# Patient Record
Sex: Male | Born: 1990
Health system: Southern US, Community
[De-identification: ages and names within clinical notes are randomized; demographics above are authoritative.]

## PROBLEM LIST (undated history)

## (undated) DIAGNOSIS — R51 Headache: Secondary | ICD-10-CM

## (undated) DIAGNOSIS — N200 Calculus of kidney: Secondary | ICD-10-CM

## (undated) DIAGNOSIS — R519 Headache, unspecified: Secondary | ICD-10-CM

## (undated) DIAGNOSIS — G43909 Migraine, unspecified, not intractable, without status migrainosus: Secondary | ICD-10-CM

## (undated) DIAGNOSIS — B019 Varicella without complication: Secondary | ICD-10-CM

## (undated) HISTORY — DX: Calculus of kidney: N20.0

## (undated) HISTORY — DX: Varicella without complication: B01.9

## (undated) HISTORY — DX: Headache, unspecified: R51.9

## (undated) HISTORY — DX: Headache: R51

## (undated) HISTORY — DX: Migraine, unspecified, not intractable, without status migrainosus: G43.909

---
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
## 2013-03-25 ENCOUNTER — Encounter: Payer: Self-pay | Admitting: Family Medicine

## 2013-03-25 ENCOUNTER — Ambulatory Visit (INDEPENDENT_AMBULATORY_CARE_PROVIDER_SITE_OTHER): Payer: Self-pay | Admitting: Family Medicine

## 2013-03-25 VITALS — BP 132/86 | Temp 98.6°F | Wt 211.0 lb

## 2013-03-25 DIAGNOSIS — L0291 Cutaneous abscess, unspecified: Secondary | ICD-10-CM

## 2013-03-25 DIAGNOSIS — L0390 Cellulitis, unspecified: Secondary | ICD-10-CM

## 2013-03-25 MED ORDER — DOXYCYCLINE HYCLATE 100 MG PO TABS: 100.0000 mg | ORAL_TABLET | Freq: Two times a day (BID) | ORAL | Status: DC

## 2013-03-25 NOTE — Progress Notes (Signed)
  Subjective:    Patient ID: Daryl Miller, male    DOB: Sep 23, 1991, 22 y.o.   MRN: [id number]  HPI  painful bump. Right lateral knee. Started as a pimple. Now sreading. Significant pain. No fever no chills.  No personal history of MRSA. Review of Systems ROS otherwise negative.    Objective:   Physical Exam  Alert no acute distress. Lungs clear. Heart regular in rhythm. Right lateral knee patch of cellulitis tender inflamed with central papule tiny amount of discharge expressed but no fluctuance      Assessment & Plan:  Impression skin structure infection with secondary cellulitis discussed plan Doxy 100 twice a day 10 days. Symptomatic care discussed. WSL

## 2013-12-10 ENCOUNTER — Ambulatory Visit (INDEPENDENT_AMBULATORY_CARE_PROVIDER_SITE_OTHER): Payer: BC Managed Care – PPO | Admitting: Family Medicine

## 2013-12-10 ENCOUNTER — Encounter: Payer: Self-pay | Admitting: Family Medicine

## 2013-12-10 VITALS — BP 120/70 | Temp 98.9°F | Ht 72.0 in | Wt 215.0 lb

## 2013-12-10 DIAGNOSIS — M25579 Pain in unspecified ankle and joints of unspecified foot: Secondary | ICD-10-CM

## 2013-12-10 DIAGNOSIS — L84 Corns and callosities: Secondary | ICD-10-CM

## 2013-12-10 NOTE — Progress Notes (Signed)
   Subjective:    Patient ID: Daryl Miller, male    DOB: [birth year], 23 y.o.   MRN: [id number]  Foot Injury  The incident occurred more than 1 week ago. Injury mechanism: fractured years ago. The pain is present in the right foot. The pain is at a severity of 6/10. The pain is moderate. The pain has been constant since onset. He reports no foreign bodies present. The symptoms are aggravated by movement. He has tried NSAIDs for the symptoms. The treatment provided mild relief.  Emesis  This is a new problem. The current episode started yesterday. The problem occurs intermittently. The problem has been unchanged. The emesis has an appearance of stomach contents. There has been no fever. He has tried nothing for the symptoms. The treatment provided no relief.   Abdominal symptoms have gone away. He feels much better   Review of Systems  Gastrointestinal: Positive for vomiting.   did not have any diarrhea no fever chills     Objective:   Physical Exam  Foot exam he has bilateral foot calluses that are very severe better causing significant problems especially on the right for he often wears tennis shoes with his work and works all day on his feet. No sign of plantar warts at this time he denies any other particular problems      Assessment & Plan:  Gastroenteritis-resolved  Significant calluses of the feet due to poor fitting shoes calluses were part down I believe that podiatrist could help remove some of these calluses and hopefully recommend possibly an insert to help him he will look into doing better shoes such as Daryl Miller

## 2014-01-09 ENCOUNTER — Encounter: Payer: Self-pay | Admitting: Family Medicine

## 2014-01-09 ENCOUNTER — Ambulatory Visit (INDEPENDENT_AMBULATORY_CARE_PROVIDER_SITE_OTHER): Payer: BC Managed Care – PPO | Admitting: Family Medicine

## 2014-01-09 VITALS — BP 130/80 | Temp 99.2°F | Ht 72.0 in | Wt 214.2 lb

## 2014-01-09 DIAGNOSIS — J019 Acute sinusitis, unspecified: Secondary | ICD-10-CM

## 2014-01-09 DIAGNOSIS — J309 Allergic rhinitis, unspecified: Secondary | ICD-10-CM

## 2014-01-09 DIAGNOSIS — R062 Wheezing: Secondary | ICD-10-CM

## 2014-01-09 MED ORDER — ALBUTEROL SULFATE HFA 108 (90 BASE) MCG/ACT IN AERS: 2.0000 | INHALATION_SPRAY | Freq: Four times a day (QID) | RESPIRATORY_TRACT | Status: DC | PRN

## 2014-01-09 MED ORDER — PREDNISONE 20 MG PO TABS: ORAL_TABLET | ORAL | Status: AC

## 2014-01-09 MED ORDER — AZITHROMYCIN 250 MG PO TABS
ORAL_TABLET | ORAL | Status: DC
Start: 2014-01-09 — End: 2014-07-03

## 2014-01-09 NOTE — Progress Notes (Signed)
   Subjective:    Patient ID: Daryl Miller, male    DOB: 02-Oct-1990, 23 y.o.   MRN: [id number]  Sinusitis This is a new problem. The current episode started in the past 7 days. Maximum temperature: 99.2. The pain is moderate. Associated symptoms include congestion, coughing, headaches, sinus pressure and sneezing. Past treatments include oral decongestants. The treatment provided no relief.  Patient states that he has no other concerns at this time.     Review of Systems  HENT: Positive for congestion, sinus pressure and sneezing.   Respiratory: Positive for cough.   Neurological: Positive for headaches.   No wheezing abdomen soft extremities no edema    Objective:   Physical Exam Eardrums normal sinus mild tenderness throat normal neck supple lungs clear heart regular       Assessment & Plan:  Allergic rhinitis prednisone taper allergy medicine as directed Reactive airway albuterol when necessary warning signs discussed Sinusitis/bronchitis Z-Pak as prescribed warnings discussed

## 2014-01-09 NOTE — Patient Instructions (Signed)
Metered Dose Inhaler (No Spacer Used) Inhaled medicines are the basis of asthma treatment and other breathing problems. Inhaled medicine can only be effective if used properly. Good technique assures that the medicine reaches the lungs. Metered dose inhalers (MDIs) are used to deliver a variety of inhaled medicines. These include quick relief or rescue medicines (such as bronchodilators) and controller medicines (such as corticosteroids). The medicine is delivered by pushing down on a metal canister to release a set amount of spray. If you are using different kinds of inhalers, use your quick relief medicine to open the airways 10 15 minutes before using a steroid if instructed to do so by your health care provider. If you are unsure which inhalers to use and the order of using them, ask your health care provider, nurse, or respiratory therapist. HOW TO USE THE INHALER 1. Remove cap from inhaler. 2. If you are using the inhaler for the first time, you will need to prime it. Shake the inhaler for 5 seconds and release four puffs into the air, away from your face. Ask your health care provider or pharmacist if you have questions about priming your inhaler. 3. Shake inhaler for 5 seconds before each breath in (inhalation). 4. Position the inhaler so that the top of the canister faces up. 5. Put your index finger on the top of the medicine canister. Your thumb supports the bottom of the inhaler. 6. Open your mouth. 7. Either place the inhaler between your teeth and place your lips tightly around the mouthpiece, or hold the inhaler 1 2 inches away from your open mouth. If you are unsure of which technique to use, ask your health care provider. 8. Breathe out (exhale) normally and as completely as possible. 9. Press the canister down with the index finger to release the medicine. 10. At the same time as the canister is pressed, inhale deeply and slowly until the lungs are completely filled. This should take  4 6 seconds. Keep your tongue down. 11. Hold the medicine in your lungs for up to 5 10 seconds (10 seconds is best). This helps the medicine get into the small airways of your lungs. 12. Breathe out slowly, through pursed lips. Whistling is an example of pursed lips. 13. Wait at least 1 minute between puffs. Continue with the above steps until you have taken the number of puffs your health care provider has ordered. Do not use the inhaler more than your health care provider directs you to. 14. Replace cap on inhaler. 15. Follow the directions from your health care provider or the inhaler insert for cleaning the inhaler. If you are using a steroid inhaler, rinse your mouth with water after your last puff, gargle, and spit out the water. Do not swallow the water. AVOID:  Inhaling before or after starting the spray of medicine. It takes practice to coordinate your breathing with triggering the spray.  Inhaling through the nose (rather than the mouth) when triggering the spray. HOW TO DETERMINE IF YOUR INHALER IS FULL OR NEARLY EMPTY You cannot know when an inhaler is empty by shaking it. A few inhalers are now being made with dose counters. Ask your health care provider for a prescription that has a dose counter if you feel you need that extra help. If your inhaler does not have a counter, ask your health care provider to help you determine the date you need to refill your inhaler. Write the refill date on a calendar or your inhaler canister.   Refill your inhaler 7 10 days before it runs out. Be sure to keep an adequate supply of medicine. This includes making sure it is not expired, and you have a spare inhaler.  SEEK MEDICAL CARE IF:   Symptoms are only partially relieved with your inhaler.  You are having trouble using your inhaler.  You experience some increase in phlegm. SEEK IMMEDIATE MEDICAL CARE IF:   You feel little or no relief with your inhalers. You are still wheezing and are feeling  shortness of breath or tightness in your chest or both.  You have dizziness, headaches, or fast heart rate.  You have chills, fever, or night sweats.  There is a noticeable increase in phlegm production, or there is blood in the phlegm. Document Released: 07/09/2007 Document Revised: 05/14/2013 Document Reviewed: 02/27/2013 [employer] Patient Information 2014 [employer].  

## 2014-07-03 ENCOUNTER — Encounter: Payer: Self-pay | Admitting: Family Medicine

## 2014-07-03 ENCOUNTER — Ambulatory Visit (INDEPENDENT_AMBULATORY_CARE_PROVIDER_SITE_OTHER): Payer: BC Managed Care – PPO | Admitting: Family Medicine

## 2014-07-03 VITALS — BP 132/80 | Temp 98.3°F | Ht 72.0 in | Wt 221.0 lb

## 2014-07-03 DIAGNOSIS — H6012 Cellulitis of left external ear: Secondary | ICD-10-CM

## 2014-07-03 MED ORDER — DOXYCYCLINE HYCLATE 100 MG PO CAPS: 100.0000 mg | ORAL_CAPSULE | Freq: Two times a day (BID) | ORAL | Status: DC

## 2014-07-03 NOTE — Progress Notes (Signed)
   Subjective:    Patient ID: Daryl Miller, male    DOB: May 10, 1991, 23 y.o.   MRN: [id number]  Otalgia  There is pain in the left ear. This is a new problem. The current episode started in the past 7 days. The problem has been unchanged. There has been no fever. The pain is moderate. Associated symptoms include headaches. He has tried NSAIDs for the symptoms. The treatment provided mild relief.   Patient states he has no other concerns at this time.    Review of Systems  HENT: Positive for ear pain.   Neurological: Positive for headaches.       Objective:   Physical Exam  On examination he does have ear canal mild cellulitis tender to touch it's very minimal I don't find an abscess. I think this should get better with antibiotics and warm compresses no need for incision and drainage      Assessment & Plan:  Antibiotics prescribed/cellulitis/gradually get better warnings discussed

## 2014-07-05 ENCOUNTER — Emergency Department (HOSPITAL_COMMUNITY)
Admission: EM | Admit: 2014-07-05 | Discharge: 2014-07-05 | Disposition: A | Discharge status: Home / Self Care | Payer: BC Managed Care – PPO | Attending: Emergency Medicine | Admitting: Emergency Medicine

## 2014-07-05 ENCOUNTER — Encounter (HOSPITAL_COMMUNITY): Payer: Self-pay | Admitting: Emergency Medicine

## 2014-07-05 DIAGNOSIS — R21 Rash and other nonspecific skin eruption: Secondary | ICD-10-CM | POA: Diagnosis not present

## 2014-07-05 DIAGNOSIS — Z792 Long term (current) use of antibiotics: Secondary | ICD-10-CM | POA: Insufficient documentation

## 2014-07-05 DIAGNOSIS — H6092 Unspecified otitis externa, left ear: Secondary | ICD-10-CM | POA: Insufficient documentation

## 2014-07-05 DIAGNOSIS — Z720 Tobacco use: Secondary | ICD-10-CM | POA: Diagnosis not present

## 2014-07-05 DIAGNOSIS — H9202 Otalgia, left ear: Secondary | ICD-10-CM | POA: Diagnosis present

## 2014-07-05 LAB — CBC WITH DIFFERENTIAL/PLATELET
Basophils Absolute: 0.1 10*3/uL (ref 0.0–0.1)
Basophils Relative: 0 % (ref 0–1)
Eosinophils Absolute: 0.1 10*3/uL (ref 0.0–0.7)
Eosinophils Relative: 1 % (ref 0–5)
HCT: 38.2 % — ABNORMAL LOW (ref 39.0–52.0)
Hemoglobin: 13.5 g/dL (ref 13.0–17.0)
Lymphocytes Relative: 34 % (ref 12–46)
Lymphs Abs: 4.1 10*3/uL — ABNORMAL HIGH (ref 0.7–4.0)
MCH: 32.7 pg (ref 26.0–34.0)
MCHC: 35.3 g/dL (ref 30.0–36.0)
MCV: 92.5 fL (ref 78.0–100.0)
Monocytes Absolute: 1.1 10*3/uL — ABNORMAL HIGH (ref 0.1–1.0)
Monocytes Relative: 9 % (ref 3–12)
Neutro Abs: 6.8 10*3/uL (ref 1.7–7.7)
Neutrophils Relative %: 56 % (ref 43–77)
Platelets: 211 10*3/uL (ref 150–400)
RBC: 4.13 MIL/uL — ABNORMAL LOW (ref 4.22–5.81)
RDW: 12.4 % (ref 11.5–15.5)
WBC: 12.2 10*3/uL — ABNORMAL HIGH (ref 4.0–10.5)

## 2014-07-05 LAB — BASIC METABOLIC PANEL
Anion gap: 12 (ref 5–15)
BUN: 12 mg/dL (ref 6–23)
CHLORIDE: 101 meq/L (ref 96–112)
CO2: 26 mEq/L (ref 19–32)
Calcium: 9 mg/dL (ref 8.4–10.5)
Creatinine, Ser: 0.79 mg/dL (ref 0.50–1.35)
GFR calc Af Amer: >90 mL/min (ref >90)
GFR calc non Af Amer: >90 mL/min (ref >90)
Glucose, Bld: 102 mg/dL — ABNORMAL HIGH (ref 70–99)
Potassium: 3.6 mEq/L — ABNORMAL LOW (ref 3.7–5.3)
Sodium: 139 mEq/L (ref 137–147)

## 2014-07-05 LAB — SEDIMENTATION RATE: SED RATE: 7 mm/h (ref 0–16)

## 2014-07-05 MED ORDER — HYDROCODONE-ACETAMINOPHEN 5-325 MG PO TABS: 1.0000 | ORAL_TABLET | Freq: Four times a day (QID) | ORAL | Status: DC | PRN

## 2014-07-05 MED ORDER — HYDROCODONE-ACETAMINOPHEN 5-325 MG PO TABS
1.0000 | ORAL_TABLET | Freq: Once | ORAL | Status: AC
  Administered 2014-07-05: 1 via ORAL
  Filled 2014-07-05: qty 1

## 2014-07-05 MED ORDER — PREDNISONE 50 MG PO TABS: 50.0000 mg | ORAL_TABLET | Freq: Every day | ORAL | Status: DC

## 2014-07-05 MED ORDER — IBUPROFEN 600 MG PO TABS: 600.0000 mg | ORAL_TABLET | Freq: Four times a day (QID) | ORAL | Status: DC | PRN

## 2014-07-05 MED ORDER — LORATADINE 10 MG PO TABS: 10.0000 mg | ORAL_TABLET | Freq: Every day | ORAL | Status: DC

## 2014-07-05 MED ORDER — AMOXICILLIN-POT CLAVULANATE 875-125 MG PO TABS
1.0000 | ORAL_TABLET | Freq: Once | ORAL | Status: AC
  Administered 2014-07-05: 1 via ORAL
  Filled 2014-07-05: qty 1

## 2014-07-05 MED ORDER — AMOXICILLIN-POT CLAVULANATE 875-125 MG PO TABS: 1.0000 | ORAL_TABLET | Freq: Two times a day (BID) | ORAL | Status: DC

## 2014-07-05 MED ORDER — PREDNISONE 50 MG PO TABS
60.0000 mg | ORAL_TABLET | Freq: Once | ORAL | Status: AC
  Administered 2014-07-05: 60 mg via ORAL
  Filled 2014-07-05 (×2): qty 1

## 2014-07-05 MED ORDER — IBUPROFEN 400 MG PO TABS
600.0000 mg | ORAL_TABLET | Freq: Once | ORAL | Status: AC
  Administered 2014-07-05: 600 mg via ORAL
  Filled 2014-07-05: qty 2

## 2014-07-05 NOTE — ED Notes (Addendum)
Pt c/o left ear pain that has progressed to the outside of the ear and around the head; pt states he went to see his PCP x 2 days ago and was given medications with no relief

## 2014-07-05 NOTE — ED Provider Notes (Signed)
CSN: [id number]     Arrival date & time 07/05/14  0033 History  This chart was scribed for Derwood KaplanAnkit Sumaiyah Markert, MD by Modena JanskyAlbert Thayil, ED Scribe. This patient was seen in room APA08/AP[facility]08 and the patient's care was started at 12:42 AM.   Chief Complaint  Patient presents with  . Otalgia   The history is provided by the patient. No language interpreter was used.   HPI Comments: Daryl Miller is a 23 y.o. male with no hx of chronic medical problems who presents to the Emergency Department complaining of left ear ache that started 5 days ago. He reports that he went to his PCP 2 days ago and was given medication without any improvement. He states that he took advil with only temporary relief. He denies any sick contacts. He denies any ear drainage or discharge.   History reviewed. No pertinent past medical history. History reviewed. No pertinent past surgical history. History reviewed. No pertinent family history. History  Substance Use Topics  . Smoking status: Current Every Day Smoker  . Smokeless tobacco: Not on file  . Alcohol Use: Not on file    Review of Systems  Constitutional: Negative for fever and chills.  HENT: Positive for ear pain. Negative for ear discharge.   Eyes: Negative for visual disturbance.  Gastrointestinal: Negative for nausea and vomiting.  Skin: Positive for rash.  Allergic/Immunologic: Negative for immunocompromised state.      Allergies  Review of patient's allergies indicates no known allergies.  Home Medications   Prior to Admission medications   Medication Sig Start Date End Date Taking? Authorizing Provider  amoxicillin-clavulanate (AUGMENTIN) 875-125 MG per tablet Take 1 tablet by mouth 2 (two) times daily. 07/05/14   Derwood KaplanAnkit Allis Quirarte, MD  doxycycline (VIBRAMYCIN) 100 MG capsule Take 1 capsule (100 mg total) by mouth 2 (two) times daily. 07/03/14   Babs SciaraScott A Luking, MD  HYDROcodone-acetaminophen (NORCO/VICODIN) 5-325 MG per tablet Take 1 tablet by mouth  every 6 (six) hours as needed. 07/05/14   Derwood KaplanAnkit Kalia Vahey, MD  ibuprofen (ADVIL,MOTRIN) 600 MG tablet Take 1 tablet (600 mg total) by mouth every 6 (six) hours as needed. 07/05/14   Derwood KaplanAnkit Laurinda Carreno, MD  loratadine (CLARITIN) 10 MG tablet Take 1 tablet (10 mg total) by mouth daily. 07/05/14   Derwood KaplanAnkit Janice Seales, MD  predniSONE (DELTASONE) 50 MG tablet Take 1 tablet (50 mg total) by mouth daily. 07/05/14   Ebunoluwa Gernert Rhunette CroftNanavati, MD   BP 159/82  Pulse 93  Temp(Src) 98.6 F (37 C) (Oral)  Resp 20  Ht 6' (1.829 m)  Wt 221 lb (100.245 kg)  BMI 29.97 kg/m2  SpO2 100% Physical Exam  Nursing note and vitals reviewed. Constitutional: He appears well-developed and well-nourished.  HENT:  RIght TM is normal. Left TM appears to be dull, no significant debri. No erythema.   Neck:  Positive mastoid TTP. Positive preauricular and postauricular adenopathy.     ED Course  Procedures (including critical care time) DIAGNOSTIC STUDIES: Oxygen Saturation is 100% on RA, normal by my interpretation.    COORDINATION OF CARE: 12:46 AM- Pt advised of plan for treatment which includes medication and labs and pt agrees.  Labs Review Labs Reviewed  CBC WITH DIFFERENTIAL - Abnormal; Notable for the following:    WBC 12.2 (*)    RBC 4.13 (*)    HCT 38.2 (*)    Lymphs Abs 4.1 (*)    Monocytes Absolute 1.1 (*)    All other components within normal limits  BASIC METABOLIC  PANEL - Abnormal; Notable for the following:    Potassium 3.6 (*)    Glucose, Bld 102 (*)    All other components within normal limits  SEDIMENTATION RATE    Imaging Review No results found.   EKG Interpretation None      MDM   Final diagnoses:  Otitis externa of left ear    I personally performed the services described in this documentation, which was scribed in my presence. The recorded information has been reviewed and is accurate.  Pt comes in with earache. Pt has mild erythema on my exam around the ear, but the ear exam really  doesn't reveal significant debri. Concerns for mild malignant otitis externa though, as pt's sx have gotten worse and his pain has spread around the ear. Pt is immunocompetent, non toxic, and has no SIRS criteria. His sed rate is normal. I doubt that the infection has spread to the skull or meninges - and so we will not get CT scan (pt does have tenderness over mastoid process).  Pt on doxy - and i have asked him to stop taking it, and we will start him on Augmentin. Return precautions discussed.   Derwood KaplanAnkit Jaelen Gellerman, MD 07/06/14 [phone]

## 2014-07-05 NOTE — Discharge Instructions (Signed)
Please return to the ER if your symptoms worsen; rash is getting worse, you have increased pain, headaches, fevers, chills, sweats, inability to keep any medications down, confusion. Otherwise see the outpatient doctor as requested.   Otitis Externa Otitis externa is a bacterial or fungal infection of the outer ear canal. This is the area from the eardrum to the outside of the ear. Otitis externa is sometimes called "swimmer's ear." CAUSES  Possible causes of infection include:  Swimming in dirty water.  Moisture remaining in the ear after swimming or bathing.  Mild injury (trauma) to the ear.  Objects stuck in the ear (foreign body).  Cuts or scrapes (abrasions) on the outside of the ear. SIGNS AND SYMPTOMS  The first symptom of infection is often itching in the ear canal. Later signs and symptoms may include swelling and redness of the ear canal, ear pain, and yellowish-white fluid (pus) coming from the ear. The ear pain may be worse when pulling on the earlobe. DIAGNOSIS  Your health care provider will perform a physical exam. A sample of fluid may be taken from the ear and examined for bacteria or fungi. TREATMENT  Antibiotic ear drops are often given for 10 to 14 days. Treatment may also include pain medicine or corticosteroids to reduce itching and swelling. HOME CARE INSTRUCTIONS   Apply antibiotic ear drops to the ear canal as prescribed by your health care provider.  Take medicines only as directed by your health care provider.  If you have diabetes, follow any additional treatment instructions from your health care provider.  Keep all follow-up visits as directed by your health care provider. PREVENTION   Keep your ear dry. Use the corner of a towel to absorb water out of the ear canal after swimming or bathing.  Avoid scratching or putting objects inside your ear. This can damage the ear canal or remove the protective wax that lines the canal. This makes it easier for  bacteria and fungi to grow.  Avoid swimming in lakes, polluted water, or poorly chlorinated pools.  You may use ear drops made of rubbing alcohol and vinegar after swimming. Combine equal parts of white vinegar and alcohol in a bottle. Put 3 or 4 drops into each ear after swimming. SEEK MEDICAL CARE IF:   You have a fever.  Your ear is still red, swollen, painful, or draining pus after 3 days.  Your redness, swelling, or pain gets worse.  You have a severe headache.  You have redness, swelling, pain, or tenderness in the area behind your ear. MAKE SURE YOU:   Understand these instructions.  Will watch your condition.  Will get help right away if you are not doing well or get worse. Document Released: 09/11/2005 Document Revised: 01/26/2014 Document Reviewed: 09/28/2011 [facility] Patient Information 2015 [employer] [place]. This information is not intended to replace advice given to you by your health care provider. Make sure you discuss any questions you have with your health care provider.

## 2015-01-13 ENCOUNTER — Ambulatory Visit: Payer: Self-pay | Admitting: Family Medicine

## 2015-06-21 ENCOUNTER — Ambulatory Visit (INDEPENDENT_AMBULATORY_CARE_PROVIDER_SITE_OTHER): Payer: BLUE CROSS/BLUE SHIELD | Admitting: Family Medicine

## 2015-06-21 ENCOUNTER — Encounter: Payer: Self-pay | Admitting: Family Medicine

## 2015-06-21 VITALS — BP 136/86 | Temp 99.1°F | Ht 72.0 in | Wt 231.0 lb

## 2015-06-21 DIAGNOSIS — J329 Chronic sinusitis, unspecified: Secondary | ICD-10-CM

## 2015-06-21 MED ORDER — AMOXICILLIN-POT CLAVULANATE 875-125 MG PO TABS: 1.0000 | ORAL_TABLET | Freq: Two times a day (BID) | ORAL | Status: AC

## 2015-06-21 NOTE — Progress Notes (Signed)
   Subjective:    Patient ID: Daryl Miller, male    DOB: [birth year], 24 y.o.   MRN: [id number]  Sinusitis This is a new problem. Episode onset: 1 week. Associated symptoms include headaches and a sore throat. (Vomiting, Runny nose) Treatments tried: vitamin C.    Unfortunately patient still smoking., Vomited today with a bad episode of coughing Frontal headache  Cough and cong,  Nauseated cough and sec vomiting  Review of Systems  HENT: Positive for sore throat.   Neurological: Positive for headaches.       Objective:   Physical Exam  Alert vitals stable mild malaise. HEENT moderate nasal congestion. Normal lungs bronchial cough no wheezes or crackles heart regular in rhythm.      Assessment & Plan:  Impression post viral rhinosinusitis/bronchitis plan antibiotics prescribed. Symptom care discussed encouraged to stop smoking WSL

## 2016-05-17 ENCOUNTER — Encounter (HOSPITAL_COMMUNITY): Payer: Self-pay

## 2016-05-17 ENCOUNTER — Emergency Department (HOSPITAL_COMMUNITY)
Admission: EM | Admit: 2016-05-17 | Discharge: 2016-05-17 | Disposition: A | Discharge status: Home / Self Care | Payer: Self-pay | Attending: Emergency Medicine | Admitting: Emergency Medicine

## 2016-05-17 ENCOUNTER — Emergency Department (HOSPITAL_COMMUNITY): Payer: Self-pay

## 2016-05-17 DIAGNOSIS — F17210 Nicotine dependence, cigarettes, uncomplicated: Secondary | ICD-10-CM | POA: Insufficient documentation

## 2016-05-17 DIAGNOSIS — R319 Hematuria, unspecified: Secondary | ICD-10-CM | POA: Insufficient documentation

## 2016-05-17 DIAGNOSIS — R630 Anorexia: Secondary | ICD-10-CM | POA: Insufficient documentation

## 2016-05-17 LAB — COMPREHENSIVE METABOLIC PANEL
ALK PHOS: 48 U/L (ref 38–126)
ALT: 32 U/L (ref 17–63)
AST: 25 U/L (ref 15–41)
Albumin: 4.6 g/dL (ref 3.5–5.0)
Anion gap: 10 (ref 5–15)
BUN: 14 mg/dL (ref 6–20)
CALCIUM: 9.2 mg/dL (ref 8.9–10.3)
CO2: 25 mmol/L (ref 22–32)
CREATININE: 0.9 mg/dL (ref 0.61–1.24)
Chloride: 100 mmol/L — ABNORMAL LOW (ref 101–111)
GFR calc non Af Amer: >60 mL/min (ref >60)
Glucose, Bld: 121 mg/dL — ABNORMAL HIGH (ref 65–99)
Potassium: 3.4 mmol/L — ABNORMAL LOW (ref 3.5–5.1)
SODIUM: 135 mmol/L (ref 135–145)
Total Bilirubin: 0.5 mg/dL (ref 0.3–1.2)
Total Protein: 7.9 g/dL (ref 6.5–8.1)

## 2016-05-17 LAB — CBC WITH DIFFERENTIAL/PLATELET
Basophils Absolute: 0.1 10*3/uL (ref 0.0–0.1)
Basophils Relative: 0 %
EOS ABS: 0.2 10*3/uL (ref 0.0–0.7)
Eosinophils Relative: 1 %
HCT: 41.1 % (ref 39.0–52.0)
HEMOGLOBIN: 14.3 g/dL (ref 13.0–17.0)
LYMPHS ABS: 4.8 10*3/uL — AB (ref 0.7–4.0)
LYMPHS PCT: 33 %
MCH: 32.4 pg (ref 26.0–34.0)
MCHC: 34.8 g/dL (ref 30.0–36.0)
MCV: 93 fL (ref 78.0–100.0)
Monocytes Absolute: 0.9 10*3/uL (ref 0.1–1.0)
Monocytes Relative: 6 %
NEUTROS PCT: 60 %
Neutro Abs: 8.5 10*3/uL — ABNORMAL HIGH (ref 1.7–7.7)
Platelets: 236 10*3/uL (ref 150–400)
RBC: 4.42 MIL/uL (ref 4.22–5.81)
RDW: 12.5 % (ref 11.5–15.5)
WBC: 14.4 10*3/uL — AB (ref 4.0–10.5)

## 2016-05-17 LAB — URINALYSIS, ROUTINE W REFLEX MICROSCOPIC
Glucose, UA: NEGATIVE mg/dL
Ketones, ur: 40 mg/dL — AB
Leukocytes, UA: NEGATIVE
Nitrite: NEGATIVE
Protein, ur: 100 mg/dL — AB
Specific Gravity, Urine: 1.025 (ref 1.005–1.030)
pH: 7 (ref 5.0–8.0)

## 2016-05-17 LAB — URINE MICROSCOPIC-ADD ON

## 2016-05-17 MED ORDER — ONDANSETRON 4 MG PO TBDP: ORAL_TABLET | ORAL | 0 refills | Status: DC

## 2016-05-17 MED ORDER — OXYCODONE-ACETAMINOPHEN 5-325 MG PO TABS: 1.0000 | ORAL_TABLET | Freq: Four times a day (QID) | ORAL | 0 refills | Status: DC | PRN

## 2016-05-17 MED ORDER — ONDANSETRON HCL 4 MG/2ML IJ SOLN
4.0000 mg | Freq: Once | INTRAMUSCULAR | Status: AC
  Administered 2016-05-17: 4 mg via INTRAVENOUS

## 2016-05-17 MED ORDER — ONDANSETRON HCL 4 MG/2ML IJ SOLN
4.0000 mg | Freq: Once | INTRAMUSCULAR | Status: AC
  Administered 2016-05-17: 4 mg via INTRAVENOUS
  Filled 2016-05-17: qty 2

## 2016-05-17 MED ORDER — HYDROMORPHONE HCL 1 MG/ML IJ SOLN
1.0000 mg | Freq: Once | INTRAMUSCULAR | Status: AC
Start: 2016-05-17 — End: 2016-05-17
  Administered 2016-05-17: 1 mg via INTRAVENOUS
  Filled 2016-05-17: qty 1

## 2016-05-17 MED ORDER — HYDROMORPHONE HCL 1 MG/ML IJ SOLN
0.5000 mg | Freq: Once | INTRAMUSCULAR | Status: AC
  Administered 2016-05-17: 0.5 mg via INTRAVENOUS
  Filled 2016-05-17: qty 1

## 2016-05-17 MED ORDER — FENTANYL CITRATE (PF) 100 MCG/2ML IJ SOLN: 50.0000 ug | INTRAMUSCULAR | Status: DC | PRN

## 2016-05-17 MED ORDER — TAMSULOSIN HCL 0.4 MG PO CAPS: 0.4000 mg | ORAL_CAPSULE | Freq: Every day | ORAL | 0 refills | Status: DC

## 2016-05-17 MED ORDER — ONDANSETRON HCL 4 MG/2ML IJ SOLN
INTRAMUSCULAR | Status: AC
  Filled 2016-05-17: qty 2

## 2016-05-17 MED ORDER — KETOROLAC TROMETHAMINE 30 MG/ML IJ SOLN
30.0000 mg | Freq: Once | INTRAMUSCULAR | Status: AC
  Administered 2016-05-17: 30 mg via INTRAVENOUS
  Filled 2016-05-17: qty 1

## 2016-05-17 NOTE — ED Notes (Signed)
To X-ray

## 2016-05-17 NOTE — Discharge Instructions (Signed)
Follow-up with [facility] next week. [place]. Return sooner if problems

## 2016-05-17 NOTE — ED Triage Notes (Signed)
RLQ pain into testicles X1 week, c/o nausea and blood in urine

## 2016-05-17 NOTE — ED Provider Notes (Signed)
[facility] DEPT Provider Note   CSN: [id number] Arrival date & time: 05/17/16  1842     History   Chief Complaint Chief Complaint  Patient presents with  . Abdominal Pain    HPI Daryl Miller is a 25 y.o. male.  Patient complaining of right flank pain for a few days worse today   The history is provided by the patient.  Abdominal Pain   This is a new problem. The current episode started more than 2 days ago. The problem occurs constantly. The problem has not changed since onset.Associated with: Nothing. Pain location: No pain on his right flank. The quality of the pain is dull. The pain is at a severity of 5/10. The pain is severe. Associated symptoms include anorexia. Pertinent negatives include diarrhea, frequency, hematuria and headaches. Nothing aggravates the symptoms. Nothing relieves the symptoms. Past workup does not include GI consult. His past medical history does not include PUD.    History reviewed. No pertinent past medical history.  Patient Active Problem List   Diagnosis Date Noted  . CLOSED FRACTURE OF METATARSAL BONE 06/22/2008    History reviewed. No pertinent surgical history.     Home Medications    Prior to Admission medications   Medication Sig Start Date End Date Taking? Authorizing Provider  ondansetron (ZOFRAN ODT) 4 MG disintegrating tablet 4mg  ODT q4 hours prn nausea/vomit 05/17/16   Bethann Berkshire, MD  oxyCODONE-acetaminophen (PERCOCET/ROXICET) 5-325 MG tablet Take 1 tablet by mouth every 6 (six) hours as needed. 05/17/16   Bethann Berkshire, MD  tamsulosin (FLOMAX) 0.4 MG CAPS capsule Take 1 capsule (0.4 mg total) by mouth daily. 05/17/16   Bethann Berkshire, MD    Family History History reviewed. No pertinent family history.  Social History Social History  Substance Use Topics  . Smoking status: Current Every Day Smoker    Packs/day: 1.00    Types: Cigarettes  . Smokeless tobacco: Never Used  . Alcohol use No     Allergies     Review of patient's allergies indicates no known allergies.   Review of Systems Review of Systems  Constitutional: Negative for appetite change and fatigue.  HENT: Negative for congestion, ear discharge and sinus pressure.   Eyes: Negative for discharge.  Respiratory: Negative for cough.   Cardiovascular: Negative for chest pain.  Gastrointestinal: Positive for abdominal pain and anorexia. Negative for diarrhea.  Genitourinary: Negative for frequency and hematuria.       Flank pain  Musculoskeletal: Negative for back pain.  Skin: Negative for rash.  Neurological: Negative for seizures and headaches.  Psychiatric/Behavioral: Negative for hallucinations.     Physical Exam Updated Vital Signs BP 123/68   Pulse (!) 59   Temp 98.4 F (36.9 C) (Tympanic)   Resp 22   Ht 6' (1.829 m)   Wt 220 lb (99.8 kg)   SpO2 99%   BMI 29.84 kg/m   Physical Exam  Constitutional: He is oriented to person, place, and time. He appears well-developed. He appears distressed.  HENT:  Head: Normocephalic.  Eyes: Conjunctivae and EOM are normal. No scleral icterus.  Neck: Neck supple. No thyromegaly present.  Cardiovascular: Normal rate and regular rhythm.  Exam reveals no gallop and no friction rub.   No murmur heard. Pulmonary/Chest: No stridor. He has no wheezes. He has no rales. He exhibits no tenderness.  Abdominal: He exhibits no distension. There is no tenderness. There is no rebound.  Genitourinary:  Genitourinary Comments: Tender right flank  Musculoskeletal: Normal  range of motion. He exhibits no edema.  Lymphadenopathy:    He has no cervical adenopathy.  Neurological: He is oriented to person, place, and time. He exhibits normal muscle tone. Coordination normal.  Skin: No rash noted. He is diaphoretic. No erythema.  Psychiatric: He has a normal mood and affect. His behavior is normal.     ED Treatments / Results  Labs (all labs ordered are listed, but only abnormal results are  displayed) Labs Reviewed  URINALYSIS, ROUTINE W REFLEX MICROSCOPIC (NOT AT [facility]) - Abnormal; Notable for the following:       Result Value   Color, Urine AMBER (*)    APPearance CLOUDY (*)    Hgb urine dipstick LARGE (*)    Bilirubin Urine SMALL (*)    Ketones, ur 40 (*)    Protein, ur 100 (*)    All other components within normal limits  CBC WITH DIFFERENTIAL/PLATELET - Abnormal; Notable for the following:    WBC 14.4 (*)    Neutro Abs 8.5 (*)    Lymphs Abs 4.8 (*)    All other components within normal limits  COMPREHENSIVE METABOLIC PANEL - Abnormal; Notable for the following:    Potassium 3.4 (*)    Chloride 100 (*)    Glucose, Bld 121 (*)    All other components within normal limits  URINE MICROSCOPIC-ADD ON - Abnormal; Notable for the following:    Squamous Epithelial / LPF 0-5 (*)    Bacteria, UA FEW (*)    All other components within normal limits    EKG  EKG Interpretation None       Radiology Dg Abdomen 1 View  Result Date: 05/17/2016 CLINICAL DATA:  Hematuria, RIGHT abdominal pain. Assess for kidney stones. EXAM: ABDOMEN - 1 VIEW COMPARISON:  None. FINDINGS: The bowel gas pattern is normal. No radio-opaque calculi or other significant radiographic abnormality are seen. Phleboliths in the pelvis. IMPRESSION: Negative. Electronically Signed   By: Awilda Metroourtnay  Bloomer M.D.   On: 05/17/2016 20:04    Procedures Procedures (including critical care time)  Medications Ordered in ED Medications  ondansetron (ZOFRAN) injection 4 mg (4 mg Intravenous Given 05/17/16 1934)  HYDROmorphone (DILAUDID) injection 1 mg (1 mg Intravenous Given 05/17/16 1934)  ketorolac (TORADOL) 30 MG/ML injection 30 mg (30 mg Intravenous Given 05/17/16 1934)  HYDROmorphone (DILAUDID) injection 0.5 mg (0.5 mg Intravenous Given 05/17/16 2056)  ondansetron (ZOFRAN) injection 4 mg (4 mg Intravenous Given 05/17/16 2114)     Initial Impression / Assessment and Plan / ED Course  I have reviewed the  triage vital signs and the nursing notes.  Pertinent labs & imaging results that were available during my care of the patient were reviewed by me and considered in my medical decision making (see chart for details).  Clinical Course    Patient improved with pain medicine and nausea medicine. Urinalysis shows hematuria. Patient got a KUB done which was unremarkable. CT scan and was down ultrasound was not available. Patient symptoms improved and was discharged home with pain medicine nausea medicine Flomax and will follow-up with the urologist next week  Final Clinical Impressions(s) / ED Diagnoses   Final diagnoses:  Hematuria    New Prescriptions New Prescriptions   ONDANSETRON (ZOFRAN ODT) 4 MG DISINTEGRATING TABLET    4mg  ODT q4 hours prn nausea/vomit   OXYCODONE-ACETAMINOPHEN (PERCOCET/ROXICET) 5-325 MG TABLET    Take 1 tablet by mouth every 6 (six) hours as needed.   TAMSULOSIN (FLOMAX) 0.4 MG CAPS CAPSULE  Take 1 capsule (0.4 mg total) by mouth daily.     Bethann BerkshireJoseph Eero Dini, MD 05/17/16 2144

## 2017-05-02 IMAGING — DX DG ABDOMEN 1V
1 series · 2 of 2 positions shown · non-contrast
Comparison: None.

CLINICAL DATA: Hematuria, RIGHT abdominal pain. Assess for kidney
stones.

EXAM:
ABDOMEN - 1 VIEW

[Series 1: abdomen kub · 0.14mm/px · 2 of 2 slices shown]
[im 1/2]
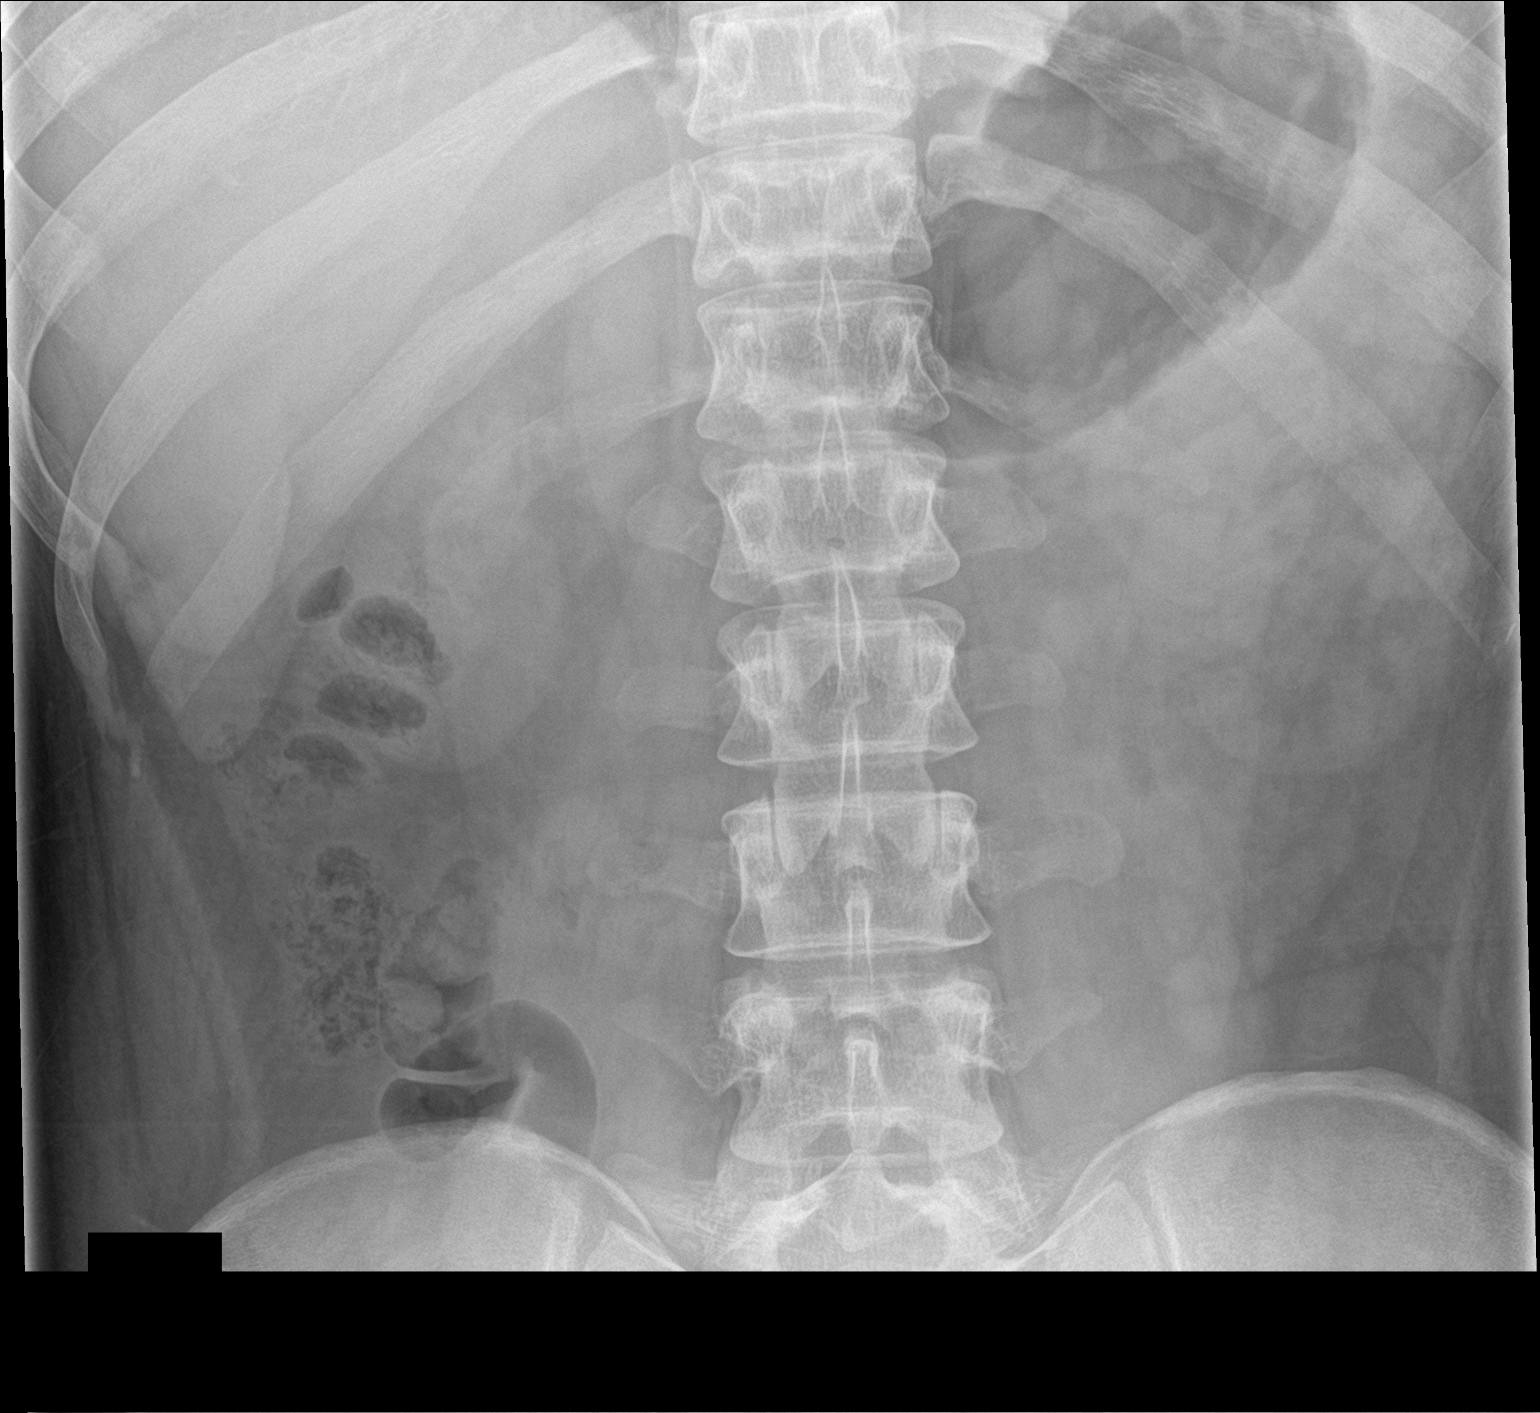
[im 2/2]
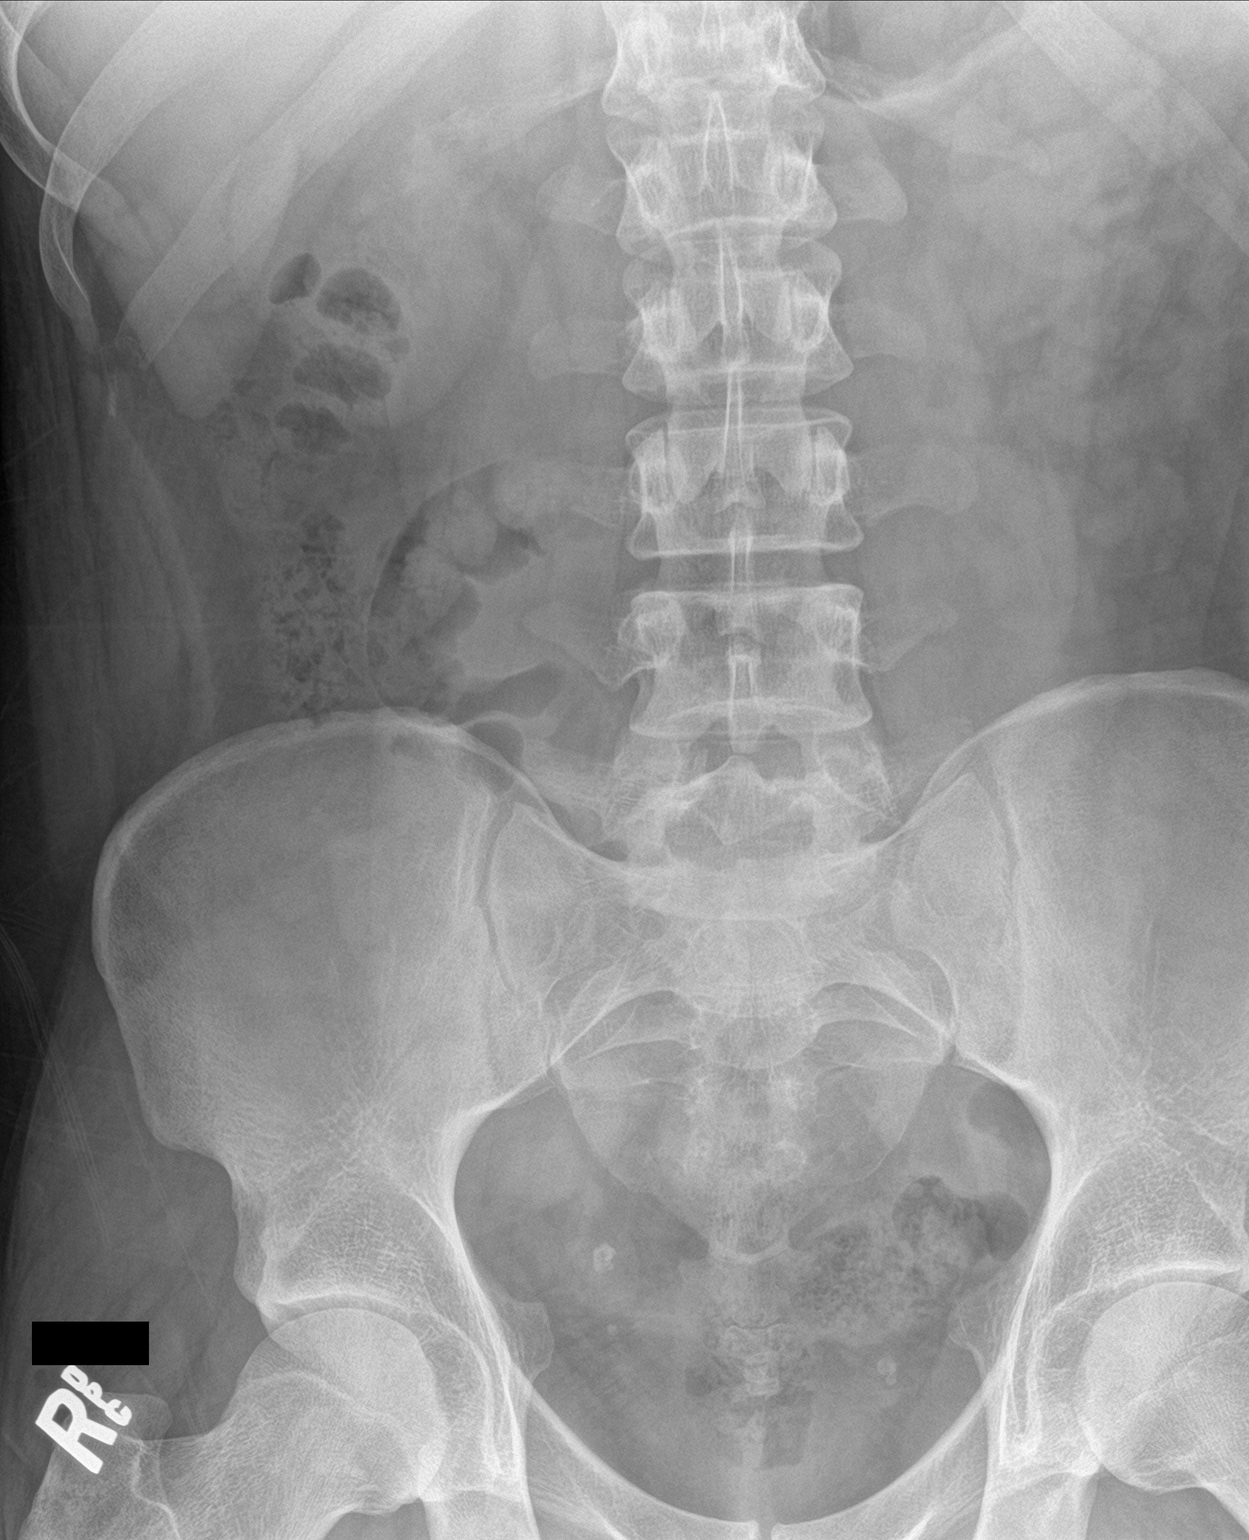

[2 of 2 positions shown; findings below may reference images not displayed]

FINDINGS: The bowel gas pattern is normal. No radio-opaque calculi or other
significant radiographic abnormality are seen. Phleboliths in the
pelvis.
IMPRESSION: Negative.

## 2018-10-21 ENCOUNTER — Ambulatory Visit: Payer: BLUE CROSS/BLUE SHIELD | Admitting: Medical

## 2018-10-21 ENCOUNTER — Encounter: Payer: Self-pay | Admitting: Medical

## 2018-10-21 VITALS — BP 159/88 | HR 62 | Temp 97.5°F | Resp 18 | Wt 269.4 lb

## 2018-10-21 DIAGNOSIS — H6123 Impacted cerumen, bilateral: Secondary | ICD-10-CM

## 2018-10-21 DIAGNOSIS — S39012A Strain of muscle, fascia and tendon of lower back, initial encounter: Secondary | ICD-10-CM

## 2018-10-21 MED ORDER — IBUPROFEN 800 MG PO TABS: 800.0000 mg | ORAL_TABLET | Freq: Three times a day (TID) | ORAL | 0 refills | Status: AC | PRN

## 2018-10-21 MED ORDER — CYCLOBENZAPRINE HCL 5 MG PO TABS
5.0000 mg | ORAL_TABLET | Freq: Every day | ORAL | 1 refills | Status: DC
Start: 2018-10-21 — End: 2020-05-21

## 2018-10-21 NOTE — Progress Notes (Addendum)
Subjective:    Patient ID: Daryl Miller, male    DOB: 02/16/1991, 28 y.o.   MRN: [id number]  HPI 28 yo male in non acute distress.  Herd today for right sided back pain x 3 4 days ago .  Mild discomfort laying on back at night. Cough started 3 days ago non product.  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 Taking Advil 600mg  twice a day, and resting.  Smoker  1/2 ppd.   Works at [employer] in [place] , says he can do his job today.  Blood pressure (!) 159/88, pulse 62, temperature (!) 97.5 F (36.4 C), temperature source Tympanic, resp. rate 18, weight 269 lb 6.4 oz (122.2 kg), SpO2 100 %. No Known Allergies  Review of Systems  Constitutional: Negative for chills, fatigue and fever.  HENT: Positive for congestion (2 weeks ago better now). Negative for ear discharge, ear pain, postnasal drip, rhinorrhea, sinus pressure, sinus pain, sneezing and sore throat.   Respiratory: Positive for shortness of breath. Negative for cough, chest tightness and wheezing.   Cardiovascular: Negative for chest pain.  Gastrointestinal: Negative for abdominal pain.  Endocrine: Negative for polydipsia, polyphagia and polyuria.  Genitourinary: Negative for dysuria and hematuria.  Musculoskeletal: Positive for back pain (right side mid back painful with palpation).  Skin: Negative for rash.  Allergic/Immunologic: Positive for environmental allergies. Negative for food allergies.  Neurological: Negative for dizziness, syncope and headaches.  Hematological: Negative for adenopathy.  Psychiatric/Behavioral: Negative for behavioral problems, confusion, self-injury and suicidal ideas.   No numbness or tingling, no radiation. Had neck pain on the right yesterday resolved today. Painful to breath    Objective:   Physical Exam Vitals signs and nursing note reviewed.  Constitutional:      Appearance: Normal appearance.  HENT:     Head: Normocephalic and  atraumatic.     Right Ear: Hearing, ear canal and external ear normal. There is impacted cerumen.     Left Ear: Hearing, ear canal and external ear normal. There is impacted cerumen.     Mouth/Throat:     Mouth: Mucous membranes are moist.     Pharynx: Oropharynx is clear.  Eyes:     Extraocular Movements: Extraocular movements intact.     Conjunctiva/sclera: Conjunctivae normal.     Pupils: Pupils are equal, round, and reactive to light.  Neck:     Musculoskeletal: Normal range of motion and neck supple.  Cardiovascular:     Rate and Rhythm: Normal rate and regular rhythm.     Heart sounds: Normal heart sounds.  Pulmonary:     Effort: Pulmonary effort is normal.     Breath sounds: Normal breath sounds.  Chest:     Chest wall: No tenderness.  Musculoskeletal:        General: Tenderness (right mid back lateral side) present.       Arms:  Skin:    General: Skin is warm and dry.     Capillary Refill: Capillary refill takes less than 2 seconds.     Findings: No erythema, lesion or rash.  Neurological:     General: No focal deficit present.     Mental Status: He is alert and oriented to person, place, and time.  Psychiatric:        Mood and Affect: Mood normal.        Behavior: Behavior normal.  Thought Content: Thought content normal.        Judgment: Judgment normal.    Clearing throat a lot in room. Can raise arms over his  head.   easily puts jacket on, Easily gets off exam table. Heel to toe within normal   Assessment & Plan:  Back strain right thoracic Cerumen impaction bilaterally Do not drink alcohol with muscle relaxer. Warned patient of sedation of SMR.  Meds ordered this encounter  Medications  . ibuprofen (ADVIL,MOTRIN) 800 MG tablet    Sig: Take 1 tablet (800 mg total) by mouth every 8 (eight) hours as needed.    Dispense:  30 tablet    Refill:  0  . cyclobenzaprine (FLEXERIL) 5 MG tablet    Sig: Take 1 tablet (5 mg total) by mouth at bedtime.     Dispense:  10 tablet    Refill:  1  warm compresses to the ara. He declines AVS. 3-5  Day follow up with PCP or here if not improving .Patient verbalizes understanding and has no questions at discharge.

## 2019-08-06 ENCOUNTER — Other Ambulatory Visit: Payer: Self-pay | Admitting: *Deleted

## 2019-08-06 DIAGNOSIS — Z20822 Contact with and (suspected) exposure to covid-19: Secondary | ICD-10-CM

## 2019-08-08 LAB — NOVEL CORONAVIRUS, NAA: SARS-CoV-2, NAA: NOT DETECTED

## 2019-09-04 ENCOUNTER — Other Ambulatory Visit: Payer: Self-pay

## 2019-09-04 DIAGNOSIS — Z20822 Contact with and (suspected) exposure to covid-19: Secondary | ICD-10-CM

## 2019-09-05 LAB — SPECIMEN STATUS REPORT

## 2019-09-06 LAB — SPECIMEN STATUS REPORT

## 2019-09-09 ENCOUNTER — Encounter: Payer: Self-pay | Admitting: Family Medicine

## 2019-09-09 ENCOUNTER — Ambulatory Visit (INDEPENDENT_AMBULATORY_CARE_PROVIDER_SITE_OTHER): Payer: BC Managed Care – PPO | Admitting: Family Medicine

## 2019-09-09 ENCOUNTER — Other Ambulatory Visit: Payer: Self-pay

## 2019-09-09 DIAGNOSIS — Z20822 Contact with and (suspected) exposure to covid-19: Secondary | ICD-10-CM

## 2019-09-09 DIAGNOSIS — J329 Chronic sinusitis, unspecified: Secondary | ICD-10-CM | POA: Diagnosis not present

## 2019-09-09 DIAGNOSIS — Z20828 Contact with and (suspected) exposure to other viral communicable diseases: Secondary | ICD-10-CM | POA: Diagnosis not present

## 2019-09-09 DIAGNOSIS — J310 Chronic rhinitis: Secondary | ICD-10-CM | POA: Diagnosis not present

## 2019-09-09 MED ORDER — CEFPROZIL 500 MG PO TABS: 500.0000 mg | ORAL_TABLET | Freq: Two times a day (BID) | ORAL | 0 refills | Status: DC

## 2019-09-09 MED ORDER — HYDROCODONE-HOMATROPINE 5-1.5 MG/5ML PO SYRP: ORAL_SOLUTION | ORAL | 0 refills | Status: DC

## 2019-09-09 NOTE — Progress Notes (Signed)
   Subjective:    Patient ID: Daryl Miller, male    DOB: [birth year], 28 y.o.   MRN: [id number]  Sinusitis This is a new problem. Episode onset: one week. Associated symptoms include congestion, coughing and headaches. (Body aches) Treatments tried: ibuprofen 800mg    pt states he had a covid test last week and it was negative.  Pt was in close contact with someone who was positive.   Virtual Visit via Telephone Note  I connected with Daryl Miller on 09/09/19 at  1:10 PM EST by telephone and verified that I am speaking with the correct person using two identifiers.  Location: Patient: home Provider: office   I discussed the limitations, risks, security and privacy concerns of performing an evaluation and management service by telephone and the availability of in person appointments. I also discussed with the patient that there may be a patient responsible charge related to this service. The patient expressed understanding and agreed to proceed.   History of Present Illness:    Observations/Objective:   Assessment and Plan:   Follow Up Instructions:    I discussed the assessment and treatment plan with the patient. The patient was provided an opportunity to ask questions and all were answered. The patient agreed with the plan and demonstrated an understanding of the instructions.   The patient was advised to call back or seek an in-person evaluation if the symptoms worsen or if the condition fails to improve as anticipated.  I provided 17 minutes of non-face-to-face time during this encounter.   Head and neck hurting  Pretty bad  Temp 100,2    Review of Systems  HENT: Positive for congestion.   Respiratory: Positive for cough.   Neurological: Positive for headaches.       Objective:   Physical Exam  Virtual      Assessment & Plan:  Impression probable COVID-19 infection.  Also possible rhinosinusitis coinfection.  Symptom care discussed.  Warning signs  discussed.  Antibiotics prescribed.  Cough medicine prescribed.  Though COVID-19 testing negative feel patient likely has a due to symptomatology and close exposure

## 2019-09-10 ENCOUNTER — Other Ambulatory Visit: Payer: Self-pay

## 2019-09-17 ENCOUNTER — Other Ambulatory Visit: Payer: Self-pay

## 2019-10-01 LAB — NOVEL CORONAVIRUS, NAA: SARS-CoV-2, NAA: NOT DETECTED

## 2019-10-07 ENCOUNTER — Ambulatory Visit: Payer: BC Managed Care – PPO | Attending: Internal Medicine

## 2019-10-07 ENCOUNTER — Other Ambulatory Visit: Payer: Self-pay

## 2019-10-07 DIAGNOSIS — Z20822 Contact with and (suspected) exposure to covid-19: Secondary | ICD-10-CM

## 2019-10-08 LAB — NOVEL CORONAVIRUS, NAA: SARS-CoV-2, NAA: NOT DETECTED

## 2019-12-21 DIAGNOSIS — S335XXA Sprain of ligaments of lumbar spine, initial encounter: Secondary | ICD-10-CM | POA: Diagnosis not present

## 2019-12-21 DIAGNOSIS — M9903 Segmental and somatic dysfunction of lumbar region: Secondary | ICD-10-CM | POA: Diagnosis not present

## 2019-12-21 DIAGNOSIS — M9904 Segmental and somatic dysfunction of sacral region: Secondary | ICD-10-CM | POA: Diagnosis not present

## 2019-12-21 DIAGNOSIS — M62830 Muscle spasm of back: Secondary | ICD-10-CM | POA: Diagnosis not present

## 2019-12-22 DIAGNOSIS — M9903 Segmental and somatic dysfunction of lumbar region: Secondary | ICD-10-CM | POA: Diagnosis not present

## 2019-12-22 DIAGNOSIS — M62830 Muscle spasm of back: Secondary | ICD-10-CM | POA: Diagnosis not present

## 2019-12-22 DIAGNOSIS — S335XXA Sprain of ligaments of lumbar spine, initial encounter: Secondary | ICD-10-CM | POA: Diagnosis not present

## 2019-12-22 DIAGNOSIS — M9904 Segmental and somatic dysfunction of sacral region: Secondary | ICD-10-CM | POA: Diagnosis not present

## 2019-12-24 DIAGNOSIS — M9903 Segmental and somatic dysfunction of lumbar region: Secondary | ICD-10-CM | POA: Diagnosis not present

## 2019-12-24 DIAGNOSIS — S335XXA Sprain of ligaments of lumbar spine, initial encounter: Secondary | ICD-10-CM | POA: Diagnosis not present

## 2019-12-24 DIAGNOSIS — M62830 Muscle spasm of back: Secondary | ICD-10-CM | POA: Diagnosis not present

## 2019-12-24 DIAGNOSIS — M9904 Segmental and somatic dysfunction of sacral region: Secondary | ICD-10-CM | POA: Diagnosis not present

## 2019-12-25 DIAGNOSIS — S335XXA Sprain of ligaments of lumbar spine, initial encounter: Secondary | ICD-10-CM | POA: Diagnosis not present

## 2019-12-25 DIAGNOSIS — M9904 Segmental and somatic dysfunction of sacral region: Secondary | ICD-10-CM | POA: Diagnosis not present

## 2019-12-25 DIAGNOSIS — M9903 Segmental and somatic dysfunction of lumbar region: Secondary | ICD-10-CM | POA: Diagnosis not present

## 2019-12-25 DIAGNOSIS — M62830 Muscle spasm of back: Secondary | ICD-10-CM | POA: Diagnosis not present

## 2019-12-30 DIAGNOSIS — M9904 Segmental and somatic dysfunction of sacral region: Secondary | ICD-10-CM | POA: Diagnosis not present

## 2019-12-30 DIAGNOSIS — M9903 Segmental and somatic dysfunction of lumbar region: Secondary | ICD-10-CM | POA: Diagnosis not present

## 2019-12-30 DIAGNOSIS — S335XXA Sprain of ligaments of lumbar spine, initial encounter: Secondary | ICD-10-CM | POA: Diagnosis not present

## 2019-12-30 DIAGNOSIS — M62830 Muscle spasm of back: Secondary | ICD-10-CM | POA: Diagnosis not present

## 2020-01-05 DIAGNOSIS — M9903 Segmental and somatic dysfunction of lumbar region: Secondary | ICD-10-CM | POA: Diagnosis not present

## 2020-01-05 DIAGNOSIS — S335XXA Sprain of ligaments of lumbar spine, initial encounter: Secondary | ICD-10-CM | POA: Diagnosis not present

## 2020-01-05 DIAGNOSIS — M62830 Muscle spasm of back: Secondary | ICD-10-CM | POA: Diagnosis not present

## 2020-01-05 DIAGNOSIS — M9904 Segmental and somatic dysfunction of sacral region: Secondary | ICD-10-CM | POA: Diagnosis not present

## 2020-02-16 DIAGNOSIS — H6123 Impacted cerumen, bilateral: Secondary | ICD-10-CM | POA: Diagnosis not present

## 2020-05-21 ENCOUNTER — Encounter: Payer: Self-pay | Admitting: Emergency Medicine

## 2020-05-21 ENCOUNTER — Ambulatory Visit (INDEPENDENT_AMBULATORY_CARE_PROVIDER_SITE_OTHER): Payer: BC Managed Care – PPO

## 2020-05-21 ENCOUNTER — Ambulatory Visit
Admission: EM | Admit: 2020-05-21 | Discharge: 2020-05-21 | Disposition: A | Discharge status: Home / Self Care | Payer: BC Managed Care – PPO | Attending: Emergency Medicine | Admitting: Emergency Medicine

## 2020-05-21 ENCOUNTER — Other Ambulatory Visit: Payer: Self-pay

## 2020-05-21 DIAGNOSIS — T148XXA Other injury of unspecified body region, initial encounter: Secondary | ICD-10-CM | POA: Diagnosis not present

## 2020-05-21 DIAGNOSIS — M79632 Pain in left forearm: Secondary | ICD-10-CM | POA: Diagnosis not present

## 2020-05-21 DIAGNOSIS — S59912A Unspecified injury of left forearm, initial encounter: Secondary | ICD-10-CM | POA: Diagnosis not present

## 2020-05-21 MED ORDER — CYCLOBENZAPRINE HCL 5 MG PO TABS: 5.0000 mg | ORAL_TABLET | Freq: Three times a day (TID) | ORAL | 0 refills | Status: DC | PRN

## 2020-05-21 MED ORDER — NAPROXEN 500 MG PO TABS: 500.0000 mg | ORAL_TABLET | Freq: Two times a day (BID) | ORAL | 0 refills | Status: DC
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
## 2020-05-21 NOTE — Discharge Instructions (Addendum)
Rest, ice and heat as needed Ensure adequate range of motion as tolerated. Injuries all appear to be muscular in nature at this time Prescribed naproxen as needed for inflammation and pain relief.  DO NOT TAKE WITH OTHER antiinflammatories, as this may cause GI upset and/or bleed Prescribed flexeril as needed at bedtime for muscle spasm.  Do not drive or operate heavy machinery while taking this medication Expect some increased pain in the next 1-3 days.  It may take 3-4 weeks for complete resolution of symptoms Will f/u with his doctor or here if not seeing significant improvement within one week. Return here or go to ER if you have any new or worsening symptoms such as numbness/tingling of the inner thighs, loss of bladder or bowel control, headache/blurry vision, nausea/vomiting, confusion/altered mental status, dizziness, weakness, passing out, imbalance, etc... 
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
## 2021-01-21 DIAGNOSIS — Z3141 Encounter for fertility testing: Secondary | ICD-10-CM | POA: Diagnosis not present
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
## 2021-05-09 DIAGNOSIS — N4601 Organic azoospermia: Secondary | ICD-10-CM | POA: Diagnosis not present

## 2021-05-31 DIAGNOSIS — E291 Testicular hypofunction: Secondary | ICD-10-CM | POA: Diagnosis not present

## 2024-06-17 ENCOUNTER — Encounter: Payer: Self-pay | Admitting: Medical

## 2024-06-17 ENCOUNTER — Ambulatory Visit: Payer: Self-pay | Admitting: Medical

## 2024-06-17 ENCOUNTER — Other Ambulatory Visit: Payer: Self-pay

## 2024-06-17 VITALS — BP 152/84 | HR 96 | Ht 73.0 in | Wt 255.0 lb

## 2024-06-17 DIAGNOSIS — M25511 Pain in right shoulder: Secondary | ICD-10-CM

## 2024-06-17 MED ORDER — PREDNISONE 20 MG PO TABS: ORAL_TABLET | ORAL | 0 refills | Status: AC
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
## 2024-06-22 ENCOUNTER — Encounter: Payer: Self-pay | Admitting: Medical

## 2024-06-22 NOTE — Patient Instructions (Signed)
-  Take steroid as prescribed with food. -You will be contacted by radiology to schedule an appointment. -Limit overhead movement with your right arm as much as possible. -You may apply ice/cold compress wrapped in a towel to your shoulder for 10-15 minutes at a time.

## 2024-10-22 ENCOUNTER — Ambulatory Visit: Admitting: Physician Assistant
